# Patient Record
Sex: Male | Born: 1945 | Race: Black or African American | Hispanic: No | Marital: Married
Health system: Southern US, Community
[De-identification: ages and names within clinical notes are randomized; demographics above are authoritative.]

## PROBLEM LIST (undated history)

## (undated) ENCOUNTER — Emergency Department: Discharge status: Absent without leave | Payer: Self-pay

---

## 2004-05-20 ENCOUNTER — Ambulatory Visit: Payer: Self-pay

## 2009-02-02 ENCOUNTER — Emergency Department: Payer: Self-pay | Admitting: Emergency Medicine

## 2009-02-02 ENCOUNTER — Inpatient Hospital Stay: Payer: Self-pay | Admitting: Internal Medicine

## 2009-10-31 ENCOUNTER — Ambulatory Visit: Payer: Self-pay | Admitting: Internal Medicine

## 2009-11-24 ENCOUNTER — Inpatient Hospital Stay: Payer: Self-pay | Admitting: Internal Medicine

## 2009-11-28 LAB — PSA

## 2009-11-30 ENCOUNTER — Ambulatory Visit: Payer: Self-pay | Admitting: Internal Medicine

## 2010-03-26 ENCOUNTER — Ambulatory Visit: Payer: Self-pay | Admitting: Gastroenterology

## 2010-06-01 ENCOUNTER — Ambulatory Visit: Payer: Self-pay | Admitting: Internal Medicine

## 2010-06-08 ENCOUNTER — Inpatient Hospital Stay: Payer: Self-pay | Admitting: Internal Medicine

## 2010-07-01 ENCOUNTER — Ambulatory Visit: Payer: Self-pay | Admitting: Internal Medicine

## 2011-01-30 ENCOUNTER — Ambulatory Visit: Payer: Self-pay | Admitting: Ophthalmology

## 2011-01-30 DIAGNOSIS — I119 Hypertensive heart disease without heart failure: Secondary | ICD-10-CM

## 2011-02-10 ENCOUNTER — Emergency Department: Payer: Self-pay | Admitting: Emergency Medicine

## 2011-03-05 ENCOUNTER — Ambulatory Visit: Payer: Self-pay | Admitting: Internal Medicine

## 2011-03-13 ENCOUNTER — Observation Stay: Payer: Self-pay | Admitting: Internal Medicine

## 2011-03-13 LAB — CBC
HGB: 9 g/dL — ABNORMAL LOW (ref 13.0–18.0)
MCH: 26.5 pg (ref 26.0–34.0)
MCV: 81 fL (ref 80–100)
Platelet: 185 10*3/uL (ref 150–440)
RBC: 3.41 10*6/uL — ABNORMAL LOW (ref 4.40–5.90)
RDW: 14.1 % (ref 11.5–14.5)
WBC: 7.9 10*3/uL (ref 3.8–10.6)

## 2011-03-13 LAB — CK TOTAL AND CKMB (NOT AT ARMC)
CK, Total: 61 U/L (ref 35–232)
CK-MB: <0.5 ng/mL — ABNORMAL LOW (ref 0.5–3.6)

## 2011-03-13 LAB — COMPREHENSIVE METABOLIC PANEL
Albumin: 3.7 g/dL (ref 3.4–5.0)
Alkaline Phosphatase: 69 U/L (ref 50–136)
Anion Gap: 10 (ref 7–16)
Bilirubin,Total: 0.3 mg/dL (ref 0.2–1.0)
Calcium, Total: 9 mg/dL (ref 8.5–10.1)
Chloride: 109 mmol/L — ABNORMAL HIGH (ref 98–107)
Co2: 23 mmol/L (ref 21–32)
EGFR (Non-African Amer.): 54 — ABNORMAL LOW
Glucose: 84 mg/dL (ref 65–99)
Potassium: 4 mmol/L (ref 3.5–5.1)
SGOT(AST): 14 U/L — ABNORMAL LOW (ref 15–37)
Total Protein: 7.1 g/dL (ref 6.4–8.2)

## 2011-03-13 LAB — PROTIME-INR: Prothrombin Time: 22.7 secs — ABNORMAL HIGH (ref 11.5–14.7)

## 2011-03-13 LAB — TROPONIN I: Troponin-I: <0.02 ng/mL

## 2011-03-14 LAB — BASIC METABOLIC PANEL
Anion Gap: 12 (ref 7–16)
BUN: 18 mg/dL (ref 7–18)
Chloride: 105 mmol/L (ref 98–107)
Co2: 24 mmol/L (ref 21–32)
Creatinine: 1.53 mg/dL — ABNORMAL HIGH (ref 0.60–1.30)
Potassium: 4.3 mmol/L (ref 3.5–5.1)

## 2011-03-14 LAB — CK TOTAL AND CKMB (NOT AT ARMC)
CK, Total: 64 U/L (ref 35–232)
CK, Total: 70 U/L (ref 35–232)
CK-MB: 0.8 ng/mL (ref 0.5–3.6)
CK-MB: 0.6 ng/mL (ref 0.5–3.6)

## 2011-03-14 LAB — CBC WITH DIFFERENTIAL/PLATELET
Basophil #: 0 10*3/uL (ref 0.0–0.1)
Basophil %: 0.3 %
Eosinophil #: 0.1 10*3/uL (ref 0.0–0.7)
Lymphocyte #: 1.4 10*3/uL (ref 1.0–3.6)
MCV: 80 fL (ref 80–100)
Neutrophil #: 4.7 10*3/uL (ref 1.4–6.5)
Platelet: 184 10*3/uL (ref 150–440)
RBC: 3.47 10*6/uL — ABNORMAL LOW (ref 4.40–5.90)
RDW: 14.2 % (ref 11.5–14.5)
WBC: 6.6 10*3/uL (ref 3.8–10.6)

## 2011-03-14 LAB — URINALYSIS, COMPLETE
Bacteria: NONE SEEN
Bilirubin,UR: NEGATIVE
Ketone: NEGATIVE
Ph: 5 (ref 4.5–8.0)
RBC,UR: NONE SEEN /HPF (ref 0–5)
Squamous Epithelial: NONE SEEN
WBC UR: NONE SEEN /HPF (ref 0–5)

## 2011-03-14 LAB — TROPONIN I: Troponin-I: 0.02 ng/mL

## 2011-03-14 LAB — LIPID PANEL
Cholesterol: 127 mg/dL (ref 0–200)
HDL Cholesterol: 34 mg/dL — ABNORMAL LOW (ref 40–60)
Triglycerides: 128 mg/dL (ref 0–200)

## 2011-03-14 LAB — IRON AND TIBC: Iron: 75 ug/dL (ref 65–175)

## 2011-03-14 LAB — PROTIME-INR: INR: 2.1

## 2011-03-15 LAB — BASIC METABOLIC PANEL
Anion Gap: 9 (ref 7–16)
Calcium, Total: 9 mg/dL (ref 8.5–10.1)
Chloride: 108 mmol/L — ABNORMAL HIGH (ref 98–107)
Co2: 25 mmol/L (ref 21–32)
Glucose: 91 mg/dL (ref 65–99)

## 2011-03-15 LAB — PROTIME-INR: Prothrombin Time: 24.7 secs — ABNORMAL HIGH (ref 11.5–14.7)

## 2011-10-04 IMAGING — NM NM LUNG SCAN
2 series · 16 of 16 positions shown · non-contrast
Comparison: none

REASON FOR EXAM: syncope/ elev dd /prostate ca
COMMENTS:

[Series 1000: lung perfusion · 1.95mm/px · 4 acquisitions, 8 frames shown]
[im 1/4]
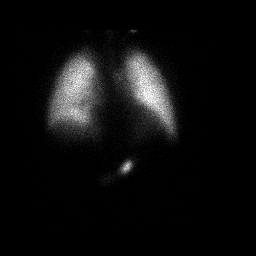
[im 1/4]
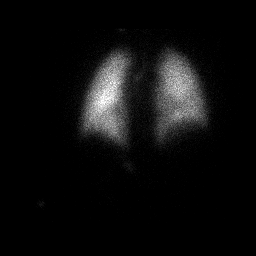
[im 2/4]
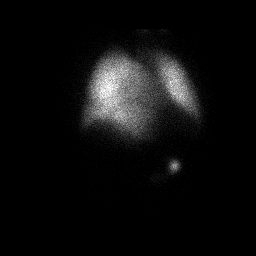
[im 2/4]
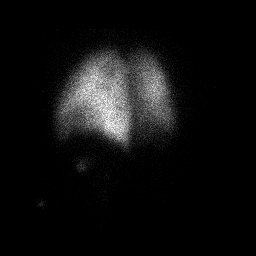
[im 3/4]
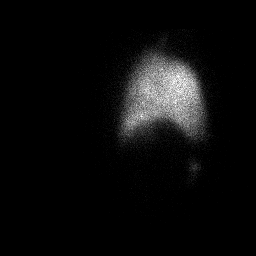
[im 3/4]
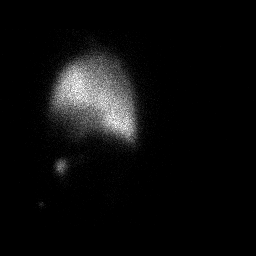
[im 4/4]
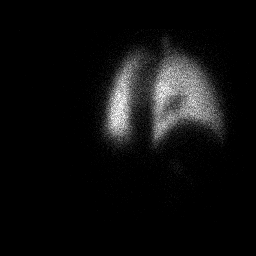
[im 4/4]
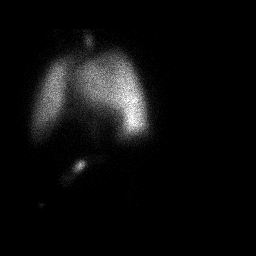

[Series 1000: lung ventilation · 3.90mm/px · 4 acquisitions, 8 frames shown]
[im 1/4]
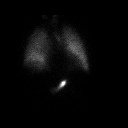
[im 1/4]
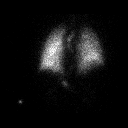
[im 2/4]
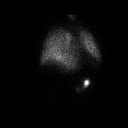
[im 2/4]
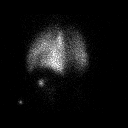
[im 3/4]
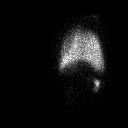
[im 3/4]
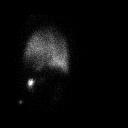
[im 4/4]
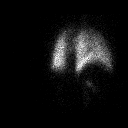
[im 4/4]
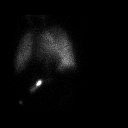

[16 of 16 positions shown; findings below may reference images not displayed]

PROCEDURE:     NM  - NM VQ LUNG SCAN  - [DATE] [DATE] [DATE]  [DATE]

RESULT:     The patient received on aerosol of 32.5 mCi of technetium 99m
labeled DTPA for the ventilation study and an intravenous infusion of
mCi of technetium 99m labeled MAA for the perfusion study. Comparison is
made to the portable chest x-ray of today's date.

Uptake of the deposition of the radiopharmaceutical on the ventilation study
is relatively symmetric from right to left. On the perfusion study I do not
see evidence of mismatched defects. There is a matched defect on the right
with the perfusion defect being greater than that on the ventilation study.
IMPRESSION: The findings are consistent with intermediate to high
probability for pulmonary embolism involving the right lung posteriorly.

This report was called by me to Dr. Nauj in the [HOSPITAL] [DATE]
p.m. on 24 November, 2009.

## 2011-10-05 IMAGING — US US EXTREM LOW VENOUS BILAT
1 series · 17 of 24 positions shown · non-contrast
Comparison: none

REASON FOR EXAM: h/o PE to see DVT
COMMENTS:

[Series 1: us extrem low venous bilat · 17 of 50 slices shown]
[im 1/50]
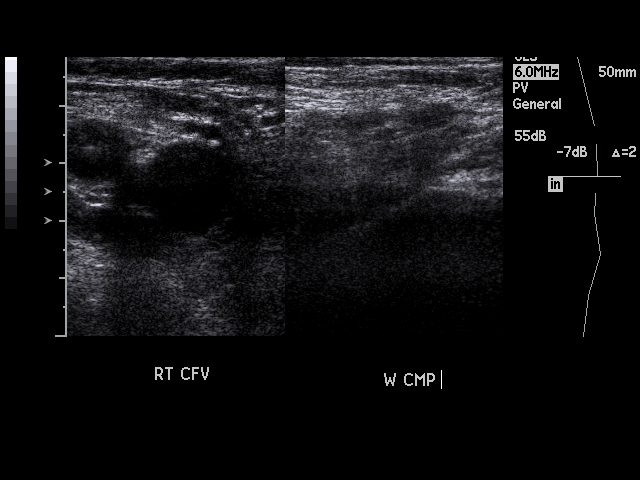
[im 5/50]
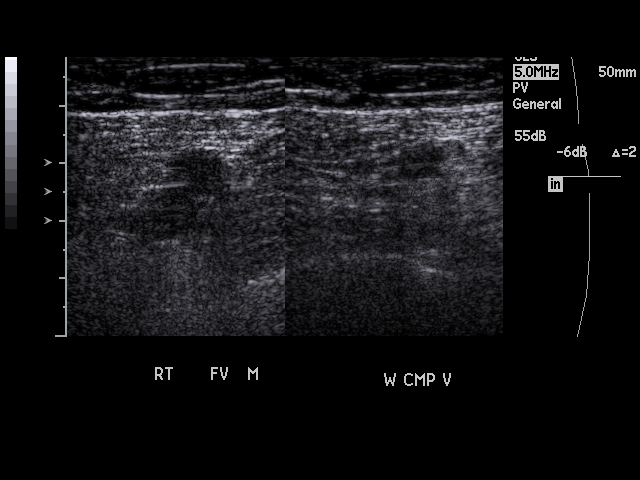
[im 7/50]
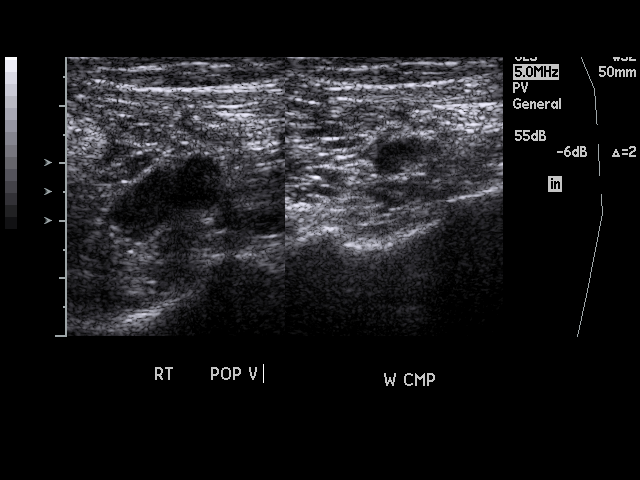
[im 9/50]
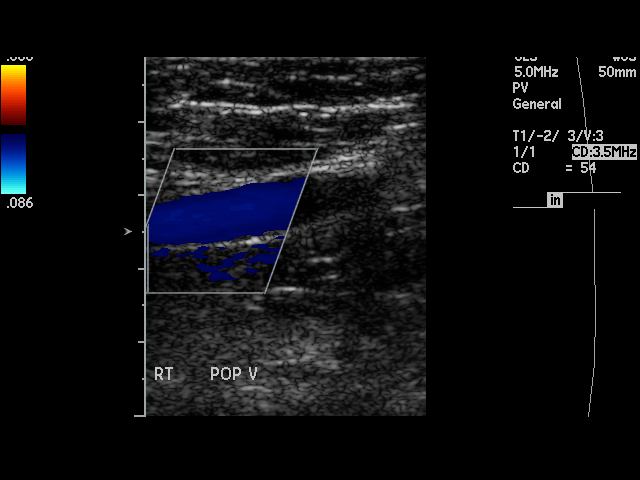
[im 13/50]
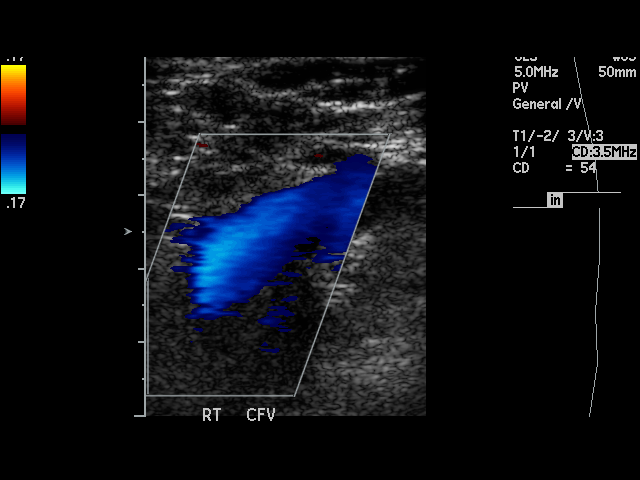
[im 15/50]
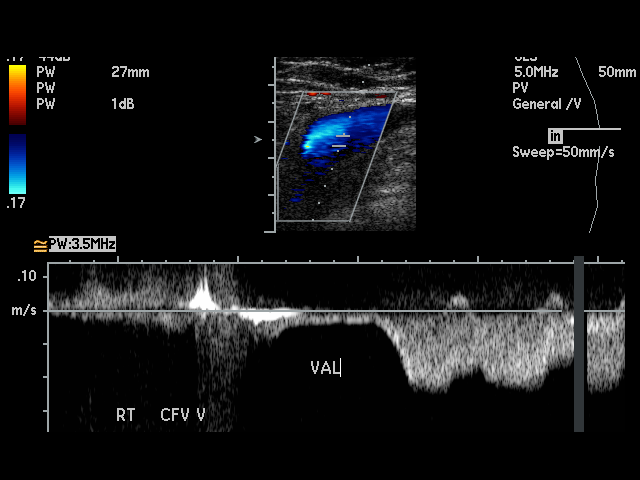
[im 20/50]
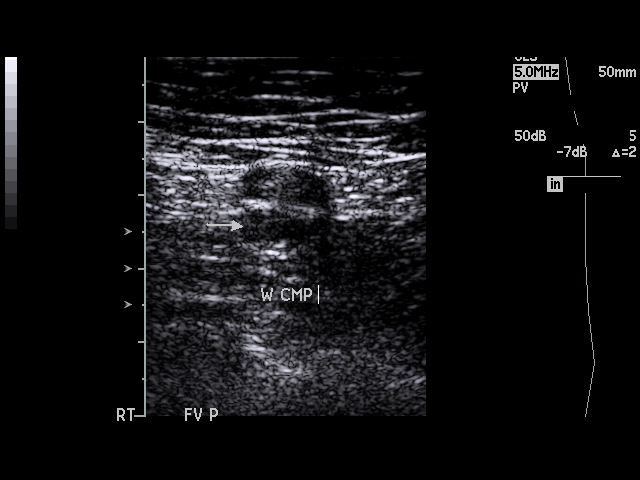
[im 22/50]
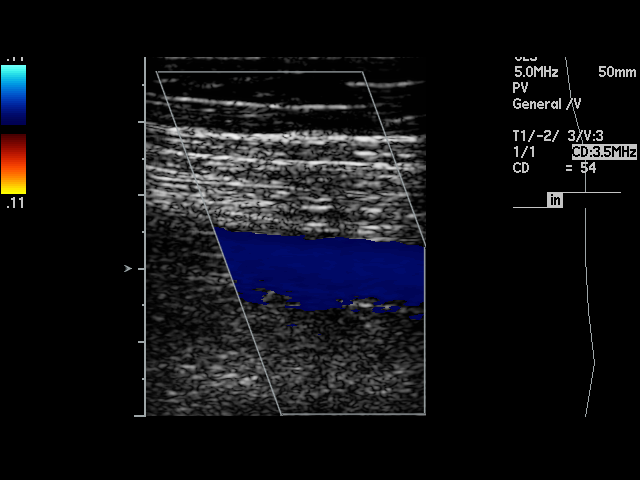
[im 26/50]
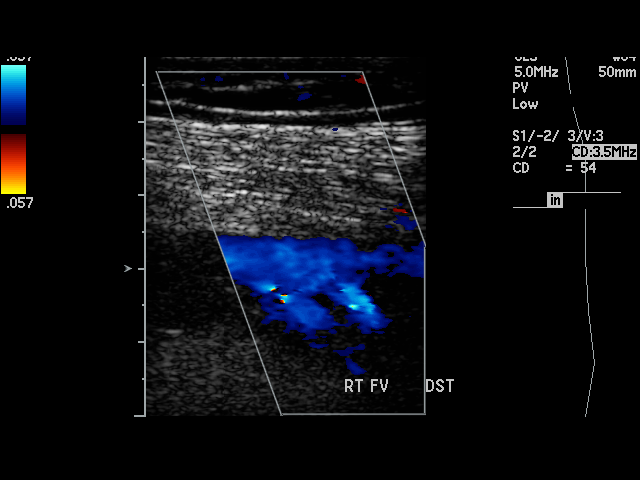
[im 28/50]
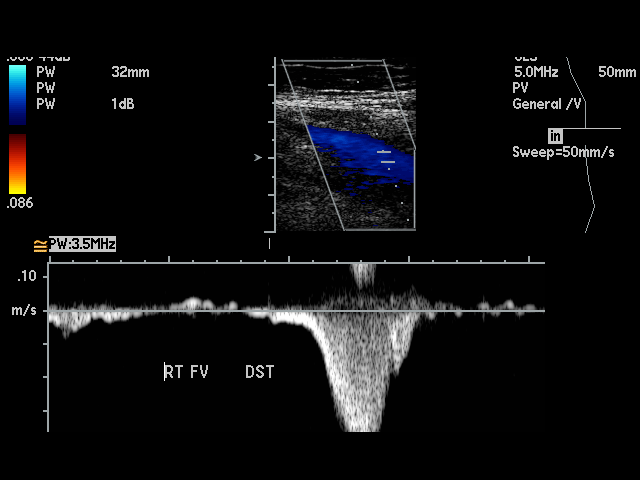
[im 30/50]
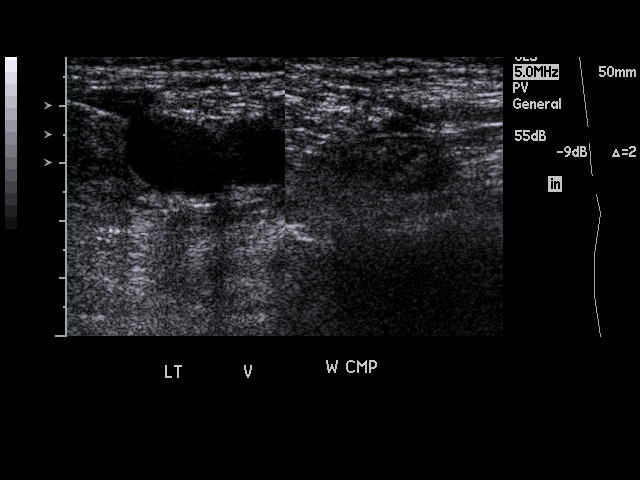
[im 35/50]
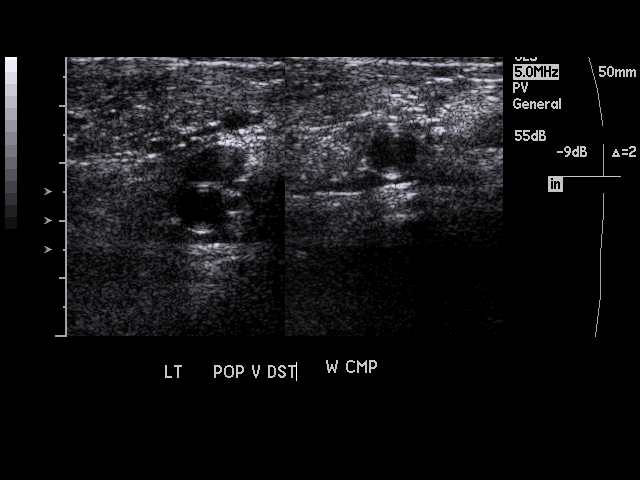
[im 37/50]
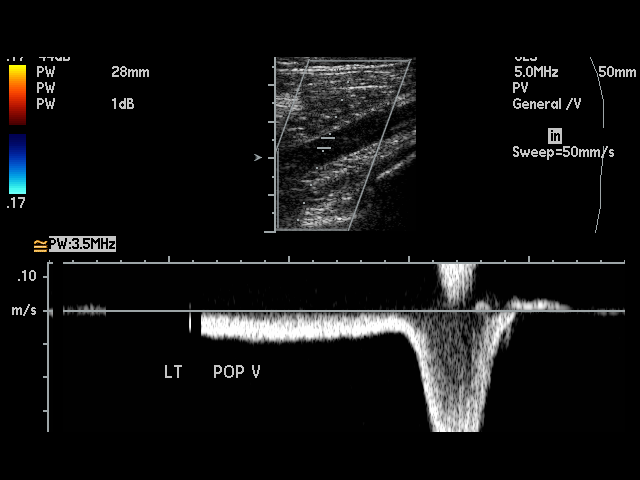
[im 41/50]
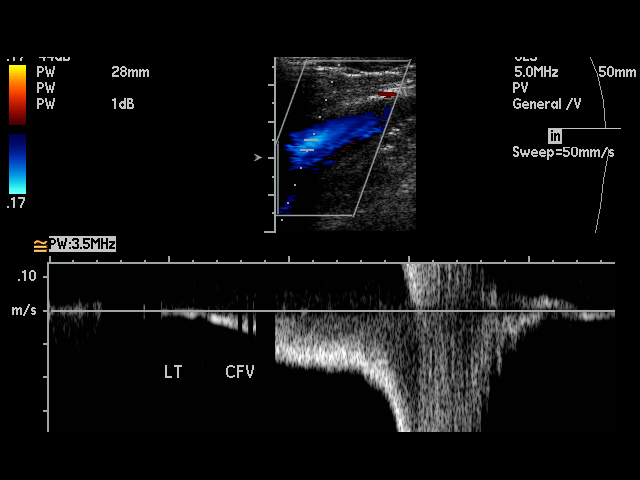
[im 43/50]
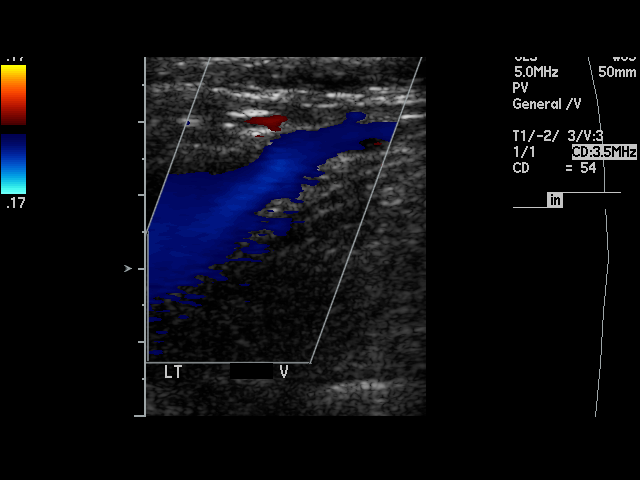
[im 45/50]
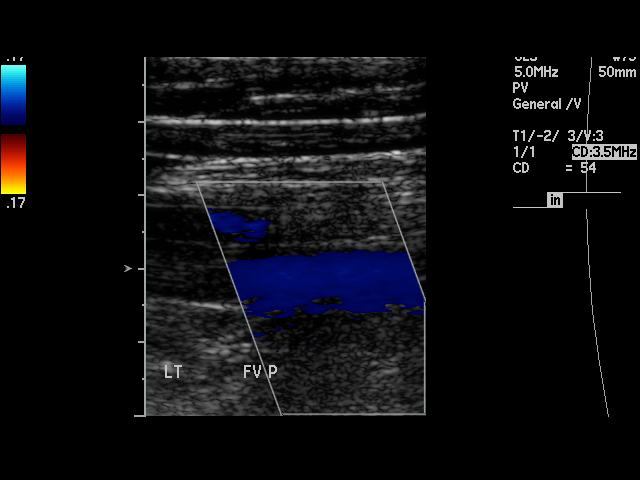
[im 50/50]
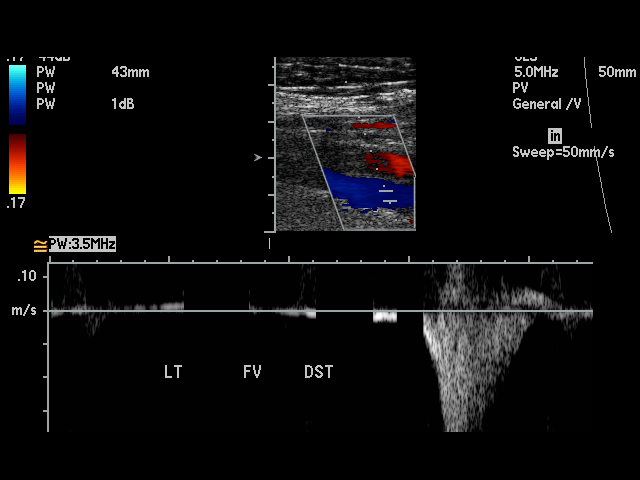

[17 of 24 positions shown; findings below may reference images not displayed]

PROCEDURE:     US  - US DOPPLER LOW EXTR BILATERAL  - November 25, 2009 [DATE]

RESULT:     Duplex Doppler interrogation of the deep venous system of both
lower extremities is performed in the standard fashion. Images demonstrate
an area of thrombus in the proximal superficial femoral vein where there is
incomplete compressibility. The thrombus appears to be nonocclusive. The
common femoral and popliteal veins compress normally and appear patent.

In the left leg the common femoral, superficial femoral and popliteal veins
show normal compressibility without DVT.
IMPRESSION: Nonocclusive superficial femoral vein thrombosis in the
right leg consistent with nonocclusive DVT.(*)

## 2011-10-30 ENCOUNTER — Emergency Department: Payer: Self-pay | Admitting: Internal Medicine

## 2011-11-27 ENCOUNTER — Emergency Department: Payer: Self-pay | Admitting: Emergency Medicine

## 2011-11-27 LAB — CBC WITH DIFFERENTIAL/PLATELET
Basophil #: 0 10*3/uL (ref 0.0–0.1)
Basophil %: 0.6 %
Eosinophil #: 0.2 10*3/uL (ref 0.0–0.7)
Eosinophil %: 4.5 %
HCT: 29.6 % — ABNORMAL LOW (ref 40.0–52.0)
HGB: 9.9 g/dL — ABNORMAL LOW (ref 13.0–18.0)
Lymphocyte #: 1.7 10*3/uL (ref 1.0–3.6)
Lymphocyte %: 31.3 %
MCHC: 33.4 g/dL (ref 32.0–36.0)
MCV: 80 fL (ref 80–100)
Monocyte #: 0.6 x10 3/mm (ref 0.2–1.0)
Monocyte %: 10.3 %
Neutrophil #: 2.9 10*3/uL (ref 1.4–6.5)
Neutrophil %: 53.3 %
RDW: 12.9 % (ref 11.5–14.5)
WBC: 5.5 10*3/uL (ref 3.8–10.6)

## 2011-11-27 LAB — BASIC METABOLIC PANEL
Anion Gap: 6 — ABNORMAL LOW (ref 7–16)
BUN: 24 mg/dL — ABNORMAL HIGH (ref 7–18)
Calcium, Total: 9.1 mg/dL (ref 8.5–10.1)
Co2: 24 mmol/L (ref 21–32)
EGFR (Non-African Amer.): 58 — ABNORMAL LOW
Glucose: 129 mg/dL — ABNORMAL HIGH (ref 65–99)
Osmolality: 283 (ref 275–301)
Potassium: 4.1 mmol/L (ref 3.5–5.1)
Sodium: 139 mmol/L (ref 136–145)

## 2011-12-04 LAB — CULTURE, BLOOD (SINGLE)

## 2012-09-29 ENCOUNTER — Ambulatory Visit: Payer: Self-pay | Admitting: Gastroenterology

## 2012-09-30 LAB — PATHOLOGY REPORT

## 2013-02-14 ENCOUNTER — Emergency Department: Payer: Self-pay | Admitting: Emergency Medicine

## 2013-02-14 LAB — CBC
HCT: 30.8 % — ABNORMAL LOW (ref 40.0–52.0)
HGB: 10.1 g/dL — ABNORMAL LOW (ref 13.0–18.0)
MCHC: 32.8 g/dL (ref 32.0–36.0)
MCV: 79 fL — ABNORMAL LOW (ref 80–100)
Platelet: 201 10*3/uL (ref 150–440)
RBC: 3.88 10*6/uL — ABNORMAL LOW (ref 4.40–5.90)

## 2013-02-14 LAB — URINALYSIS, COMPLETE
Bacteria: NONE SEEN
Hyaline Cast: 1
Ketone: NEGATIVE
Nitrite: NEGATIVE
Ph: 5 (ref 4.5–8.0)
Protein: 30
Squamous Epithelial: NONE SEEN
WBC UR: 4 /HPF (ref 0–5)

## 2013-02-14 LAB — COMPREHENSIVE METABOLIC PANEL
Albumin: 3.5 g/dL (ref 3.4–5.0)
Alkaline Phosphatase: 59 U/L
Anion Gap: 4 — ABNORMAL LOW (ref 7–16)
BUN: 24 mg/dL — ABNORMAL HIGH (ref 7–18)
Calcium, Total: 9.4 mg/dL (ref 8.5–10.1)
Creatinine: 1.27 mg/dL (ref 0.60–1.30)
EGFR (African American): >60
EGFR (Non-African Amer.): 59 — ABNORMAL LOW
Glucose: 194 mg/dL — ABNORMAL HIGH (ref 65–99)
Osmolality: 285 (ref 275–301)
Potassium: 3.5 mmol/L (ref 3.5–5.1)
SGPT (ALT): 15 U/L (ref 12–78)

## 2013-02-14 LAB — PROTIME-INR: INR: 2.1

## 2013-02-14 LAB — TROPONIN I: Troponin-I: 0.02 ng/mL

## 2013-09-02 ENCOUNTER — Emergency Department: Payer: Self-pay | Admitting: Emergency Medicine

## 2013-09-02 LAB — COMPREHENSIVE METABOLIC PANEL
ANION GAP: 9 (ref 7–16)
Albumin: 3.9 g/dL (ref 3.4–5.0)
Alkaline Phosphatase: 78 U/L
BUN: 22 mg/dL — ABNORMAL HIGH (ref 7–18)
Bilirubin,Total: 0.2 mg/dL (ref 0.2–1.0)
Calcium, Total: 9.4 mg/dL (ref 8.5–10.1)
Chloride: 109 mmol/L — ABNORMAL HIGH (ref 98–107)
Co2: 22 mmol/L (ref 21–32)
Creatinine: 1.36 mg/dL — ABNORMAL HIGH (ref 0.60–1.30)
EGFR (Non-African Amer.): 54 — ABNORMAL LOW
Glucose: 244 mg/dL — ABNORMAL HIGH (ref 65–99)
Osmolality: 291 (ref 275–301)
POTASSIUM: 3.9 mmol/L (ref 3.5–5.1)
SGOT(AST): 11 U/L — ABNORMAL LOW (ref 15–37)
SGPT (ALT): 17 U/L (ref 12–78)
SODIUM: 140 mmol/L (ref 136–145)
TOTAL PROTEIN: 7.8 g/dL (ref 6.4–8.2)

## 2013-09-02 LAB — URINALYSIS, COMPLETE
BACTERIA: NEGATIVE
Bilirubin,UR: NEGATIVE
Ketone: NEGATIVE
Leukocyte Esterase: NEGATIVE
NITRITE: NEGATIVE
Ph: 5 (ref 4.5–8.0)
Protein: NEGATIVE
Specific Gravity: 1.022 (ref 1.003–1.030)

## 2013-09-02 LAB — CBC WITH DIFFERENTIAL/PLATELET
BASOS ABS: 0 10*3/uL (ref 0.0–0.1)
BASOS PCT: 0.5 %
Eosinophil #: 0.1 10*3/uL (ref 0.0–0.7)
Eosinophil %: 2.1 %
HCT: 32.4 % — ABNORMAL LOW (ref 40.0–52.0)
HGB: 10.6 g/dL — ABNORMAL LOW (ref 13.0–18.0)
Lymphocyte #: 1.2 10*3/uL (ref 1.0–3.6)
Lymphocyte %: 16.5 %
MCH: 26.4 pg (ref 26.0–34.0)
MCHC: 32.7 g/dL (ref 32.0–36.0)
MCV: 81 fL (ref 80–100)
MONO ABS: 0.4 x10 3/mm (ref 0.2–1.0)
MONOS PCT: 5.4 %
NEUTROS ABS: 5.3 10*3/uL (ref 1.4–6.5)
Neutrophil %: 75.5 %
PLATELETS: 185 10*3/uL (ref 150–440)
RBC: 4 10*6/uL — AB (ref 4.40–5.90)
RDW: 14.7 % — AB (ref 11.5–14.5)
WBC: 7 10*3/uL (ref 3.8–10.6)

## 2014-05-08 ENCOUNTER — Emergency Department: Payer: Self-pay | Admitting: Emergency Medicine

## 2014-06-24 NOTE — H&P (Signed)
PATIENT NAME:  Kyle Greer, Kyle Greer MR#:  [id number] DATE OF BIRTH:  1945-03-23  DATE OF ADMISSION:  03/13/2011  REFERRING PHYSICIAN: Dr. Shaune PollackLord   CHIEF COMPLAINT: Chest pain, confusion.   HISTORY OF PRESENT ILLNESS: The patient is a 69 year old African American male with history of PE cardiomyopathy with initial EF of 25% which improved to 55% on later echo's, diabetes, hypertension, and history of CHF who brought in by ambulance after complaints of chest pain. The patient had [facility]. He had gone to check his INR and also his blood sugars. The patient's blood sugars had been running on the higher side up to 700's as of last month. There he was noted with sugars of greater than 300's. He was given insulin twice per patient. He has never been on insulin before and he is on metformin and glipizide. The patient went home and there he had chest pains around 2 to 3 p.m. They lasted about 20 minutes and he called the ambulance. The patient was brought in here and noted to have confusion, altered mental status, and significant lethargy. Blood sugars were checked and they were 33 and on repeat it was 29. He was diaphoretic and confused. Upon D50 administration, his mental status significantly improved and blood sugars were checked and they were in the 190's. The patient states that he has had no chest pains before the administration of insulin. Currently he is chest pain free. Hospitalist services were contacted for admission and observation to rule out MI. He denies having any dizziness, shortness of breath, or visual changes currently.   PAST MEDICAL HISTORY:  1. History of syncope in the past. 2. History of PE. 3. Diabetes. 4. History of congestive heart failure. 5. 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 Last echo was 55% last year.  6. History of prostate cancer,  on Lupron therapy. 7. History of alcohol and tobacco abuse in the past. 8. Hypertension.   ALLERGIES: No known drug allergies.   MEDICATIONS:  1. Coumadin 7.5 mg every day except Tuesdays and Thursdays where it is 10 mg. 2. Norvasc 5 mg daily.  3. Glucotrol XL 10 mg twice a day. 4. Lupron Depo, unknown dose. 5. Vitamin D3 1000 units daily.  6. Calcium with Vitamin D 1 tab b.i.d.  7. Omeprazole 20 mg twice daily. 8. Pravachol 20 mg at bedtime.  9. Ferrous sulfate 325 mg t.i.d.  10. Flomax 0.4 mg daily.  11. Coreg 25 mg twice a day. 12. Metformin 500 mg twice daily.   FAMILY HISTORY: History of early coronary artery disease, MI, and cancer.   SOCIAL HISTORY: Ex-smoker, quit multiple years ago. Quit drinking 11 years ago. Retired.   REVIEW OF SYSTEMS: No fever or fatigue. Weakness today which resolved. EYES: No blurry vision, double vision, or redness. ENT: No tinnitus or hearing loss. No snoring. RESPIRATORY: No cough, hemoptysis, or dyspnea. CARDIOVASCULAR: Episode of chest pain earlier today, now resolved. No orthopnea or edema. No history of arrhythmia. History of cardiomyopathy and CHF. GI: Had nausea and vomiting in the ambulance, now better. No abdominal pain or melena. GU: No dysuria or hematuria. ENDOCRINE: No polyuria or nocturia. HEME/LYMPH: History of anemia. No bleeding or swollen glands. SKIN: No rashes. MUSCULOSKELETAL: No muscle or joint pain. NEUROLOGIC: No weakness currently. No ataxia or dementia. PSYCH: No anxiety or insomnia.   PHYSICAL EXAMINATION:   VITAL  SIGNS: Temperature 97.3, pulse rate 61, respiratory rate 16, blood pressure currently 114/77, oxygen sat 98% on room air.   GENERAL: The patient is currently awake, alert, oriented x3 talking in full sentences not acutely distressed. Generally he is an [place] American male laying in bed, slight diaphoresis.   CARDIOVASCULAR: S1, S2 regular rate and rhythm. No murmurs, rubs, or gallops.   HEENT: Normocephalic,  atraumatic. Pupils are equal and reactive. Anicteric sclerae. Moist mucous membranes.   NECK: Supple. No thyroid tenderness or JVD.   LUNGS: Clear to auscultation. No wheezing or rhonchi.   ABDOMEN: Soft, nontender, nondistended. Positive bowel sounds.   EXTREMITIES: No significant edema.   NEUROLOGIC: Cranial nerves II through XII grossly intact. Strength 5 out of 5 in all extremities. Sensation intact.   LABORATORY, DIAGNOSTIC, AND RADIOLOGICAL DATA: Initial glucose 33, on repeat 29. Last glucose 190's. Glucose on BMP 84. BUN 18, creatinine 1.42, sodium 142, potassium 4. Troponin negative. LFTs not significantly elevated. WBC 7.9, hemoglobin 9, hematocrit 27.6, platelets 185.   ASSESSMENT AND PLAN: We have a 69 year old African American male with history of cardiomyopathy, nonischemic, which has improved, diabetes, hypertension, not on insulin, PE on Coumadin with recent hypoglycemic episodes who presents with severe hypoglycemia, chest pain, and sinus bradycardia.  1. Chest pain. This likely was precipitated by severe hypoglycemia that the patient experienced after getting insulin. We do not know how much insulin he got or what type of insulin he got. Will admit the patient for observation to monitor the blood glucoses frequently. He has no further chest pains. EKG did not show any acute ST elevations or depression. He had a clean cath in 2010 per records and I would continue aspirin, reduce the dose of the Coreg given the sinus bradycardia, and resume the statin. Would check lipids, hemoglobin A1c, and check cyclic cardiac markers. Would also check an echocardiogram. Would not order a stress test because this episode of chest pain was in the setting of severe hypoglycemia which the patient experienced.  2. Hypoglycemia. This is better now with D50. Would not give further insulin and hold the patient's glipizide and metformin. Will check a hemoglobin A1c and if no further hypoglycemic episodes  would likely discharge the patient tomorrow.  3. Hypertension. Would hold amlodipine and continue Coreg at a lower dose, however, given the sinus bradycardia on EKG.  4. Altered mental status. The patient experienced lethargy and likely metabolic encephalopathy from hypoglycemia which appears to be resolved. Monitor the patient overnight.  5. History of PE. Will check a PT and INR and resume the Coumadin.  CODE STATUS: FULL CODE.   TOTAL TIME SPENT: 45 minutes.   ____________________________ Krystal Eaton, MD sa:drc D: 03/13/2011 19:34:27 ET T: 03/14/2011 07:35:17 ET JOB#: [id number]  cc: Krystal Eaton, MD, <Dictator> Angus Palms, MD Krystal Eaton MD ELECTRONICALLY SIGNED 03/31/2011 15:48

## 2014-06-24 NOTE — Discharge Summary (Signed)
PATIENT NAME:  Kyle Greer, Kyle Greer MR#:  [id number] DATE OF BIRTH:  Dec 09, 1945  DATE OF ADMISSION:  03/13/2011 DATE OF DISCHARGE:  03/15/2011  PRIMARY CARE PHYSICIAN: Dr. Greggory Stallion   PRIMARY CARDIOLOGIST: Dr. Juliann Pares   PRIMARY HEMATOLOGIST: Dr. Haze Rushing   REASON FOR ADMISSION: Chest pain and confusion.   DISCHARGE DIAGNOSES:  1. Chest pain, resolved, atypical, suspected noncardiac. Could be from anxiety or musculoskeletal component versus related to hypoglycemia. He is status post cardiac cath December 2010 revealing normal coronary arteries.  2. Hypoglycemia, resolved. 3. Confusion with metabolic encephalopathy due to hypoglycemia.  4. Diabetes mellitus which is extremely poorly controlled/uncontrolled with hemoglobin A1c of 15.4, failing oral hypoglycemics and patient has been switched to insulin.  5. Acute renal failure, prerenal, resolved.  6. History of hypertension. 7. History of chronic anemia with previous workup revealing slight iron deficiency. The patient has also had a GI workup with negative EGD and colonoscopy.  8. History of PE.  9. 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  10. History of syncope.  11. History of prostate cancer.  12. History of alcohol abuse. 13. History of tobacco abuse.   CONSULTS: None.   PROCEDURE: Portable chest x-ray 03/13/2011 no acute cardiopulmonary abnormalities.   PERTINENT LABORATORY DATA: Creatinine 1.42 on admission, 1.16 on the day of discharge. Iron panel serum iron 75, TIBC 238, iron sat 32, ferritin 598. Hemoglobin A1c 15.4. Lipid panel total cholesterol 127, triglycerides 128, HDL 34, LDL 67. LFTs normal on admission. Cardiac enzymes negative x3 sets with negative CK, CK-MB, and troponins. Hemoglobin 9 on admission, 9.2 from 03/14/2011. INR 2 on admission,  2.2 on the day of discharge. EKG on admission sinus rhythm without acute ST or T wave changes. Serum glucose 91 on the day of discharge.   BRIEF HISTORY/HOSPITAL COURSE: The patient is a 69 year old male with past medical history of hypertension, diabetes mellitus, syncope, PE, nonischemic cardiomyopathy and CHF with previous EF 25%, most recent EF 55%, history of prostate cancer, tobacco and alcohol abuse who presented to the Emergency Department with complaints of chest pain and was confused. Please see dictated admission history and physical for pertinent details surrounding the onset of this hospitalization. Please see below for further details.  1. Confusion felt to be metabolic encephalopathy due to hypoglycemia as the patient's glucose levels were less than 60 prior to arrival. Hypoglycemia was felt to be a combination of pills (metformin plus sulfonylurea) plus he had received two injections of insulin the same day at his primary care physician's office due to uncontrolled hyperglycemia. He came in in a confused state. Initially his oral hypoglycemics in addition to insulin was held and he was given IV D50 which led to resolution of his hypoglycemia and also resolution of his metabolic encephalopathy and confusion and mental status returned back to baseline after resolution of his hypoglycemia. Confusion has not recurred. Sugars have stabilized and started to become elevated.  2. In regards to his type II diabetes mellitus, this appears to be extremely poorly controlled with hemoglobin A1c of 15.4 and has been failing oral hypoglycemics. Therefore, glipizide and metformin have been stopped and he has been started on insulin consisting of Lantus and sliding scale insulin. His blood glucose levels were much better controlled thereafter. The patient has received diabetic education and insulin teaching. He will be discharged on Lantus and NovoLog sliding scale  insulin and will be referred to Endocrinology  upon hospital discharge for diabetic management.  3. Chest pain, felt to be noncardiac and was atypical and EKG without acute ST-T wave changes and he has ruled out for MI based on three negative sets of cardiac enzymes. He had a cardiac cath in December 2010 revealing normal coronary arteries. The patient's most recent echocardiogram revealed EF 55% with no wall motion abnormalities. Cardiac etiology of his chest pain was felt to be less likely and his chest pain was felt to be due to anxiety or musculoskeletal component versus hypoglycemia. His chest pain had promptly resolved without specific therapy and has not recurred since. He was ambulated on the day of discharge without any chest pain at rest or with ambulation. No further cardiac diagnostics are recommended at this time and the patient can continue to follow-up with his cardiologist upon hospital discharge and the patient was agreeable to this plan.  4. Hypertension. The patient had some episodes of uncontrolled hypertension during this hospitalization and antihypertensives have been adjusted and thereafter his blood pressure has been much better controlled. His Coreg dose was reduced given mild bradycardia which is asymptomatic and his heart rate has normalized after decreasing Coreg. Cannot increase the dose of his beta-blocker any further at this time. Have added Norvasc to his regimen. Would avoid ACE inhibitors for now as he came with acute renal failure. Blood pressure is currently well controlled on Coreg and Norvasc and he will need close outpatient follow-up for routine blood pressure checks.  5. Acute renal failure, nonoliguric, suspect this is prerenal and possibly from volume depletion as the patient's renal function has normalized with IV fluids and hydration.  6. Chronic anemia. Previous work-up had revealed slight iron deficiency and the patient had a GI work-up in the past with negative EGD and colonoscopy. There were no indications for  blood transfusion during this hospitalization and he will need close monitoring of his hemoglobin and hematocrit as an outpatient and will follow-up with Dr. Haze RushingYbanez in this regard. In the meanwhile, he will continue iron supplementation therapy.   7. History of PE. The patient is to continue Coumadin. His INR was therapeutic on admission and also on discharge.  8. 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 In terms of his cardiomyopathy, he had remained well compensated during this hospitalization. He will continue medical management with Coreg. He was not given any ACE inhibitors for now as he came with acute renal failure. He is currently euvolemic after IV fluids. He was not volume overloaded and there is no role for diuretic currently. He will continue to follow-up with his cardiologist upon hospital discharge.   On 03/14/2010 the patient was hemodynamically stable without any chest pain and was not confused and was felt to be stable for discharge home with close outpatient follow-up to which the patient was agreeable.   DISCHARGE DISPOSITION: Home.   DISCHARGE CONDITION: Improved, stable.   DISCHARGE ACTIVITY: As tolerated.   DISCHARGE DIET: Low sodium, ADA, low fat, low cholesterol.   DISCHARGE MEDICATIONS:  1. Coumadin 7.5 mg p.o. daily except 10 mg on Tuesday, Thursday, and Saturday.  2. Lantus 20 units subcutaneously at bedtime.  3. NovoLog sliding scale insulin q.a.c. and at bedtime. See prescription for sliding scale insulin.  4. Coreg  12.5 mg p.o. b.i.d.  5. Flomax 0.4 mg p.o. daily.  6. Norvasc 5 mg daily.  7. Vitamin D3 1000 units daily.  8. Calcium carbonate 600 mg p.o. b.i.d.  9. Pravachol 20 mg p.o. daily.  10. Iron sulfate 325 mg p.o.  t.i.d.  11. Omeprazole/sodium bicarbonate 20 mg/1100 mg take one cap p.o. b.i.d.   DISCHARGE INSTRUCTIONS:  1. Take medications as prescribed.  2. Return to the Emergency Department for recurrence of symptoms.  DO NOT TAKE: Do not take glipizide or metformin for now until further advised by Endocrinology.   FOLLOW-UP INSTRUCTIONS:  1. Follow-up with Dr. Greggory Stallion within 1 to 2 weeks. The patient needs repeat PT/INR and repeat BMP within one week. 2. Follow-up with Dr. Juliann Pares within 1 to 2 weeks.  3. Follow-up with Dr. Haze Rushing within 2 to 3 weeks. The patient needs repeat hemoglobin and hematocrit check within one week.   REFERRAL: The patient is being referred to Dr. Tedd Sias of Endocrinology within 1 to 2 weeks for uncontrolled diabetes mellitus.   TIME SPENT ON DISCHARGE: Greater than 30 minutes.  ____________________________ Elon Alas, MD knl:drc D: 03/18/2011 18:06:53 ET T: 03/19/2011 12:48:34 ET JOB#: [id number] cc: Elon Alas, MD, <Dictator>, Angus Palms, MD, Dwayne D. Juliann Pares, MD, Legrand Pitts, MD, Macy Mis, MD Elon Alas MD ELECTRONICALLY SIGNED 03/31/2011 18:42

## 2014-12-25 IMAGING — CR DG CHEST 2V
1 series · 2 of 2 positions shown · non-contrast
Comparison: 03/13/2011.

CLINICAL DATA: Chest pain.

EXAM:
CHEST  2 VIEW

[Series 1: pa · 0.17mm/px · 2 of 2 slices shown]
[im 1/2]
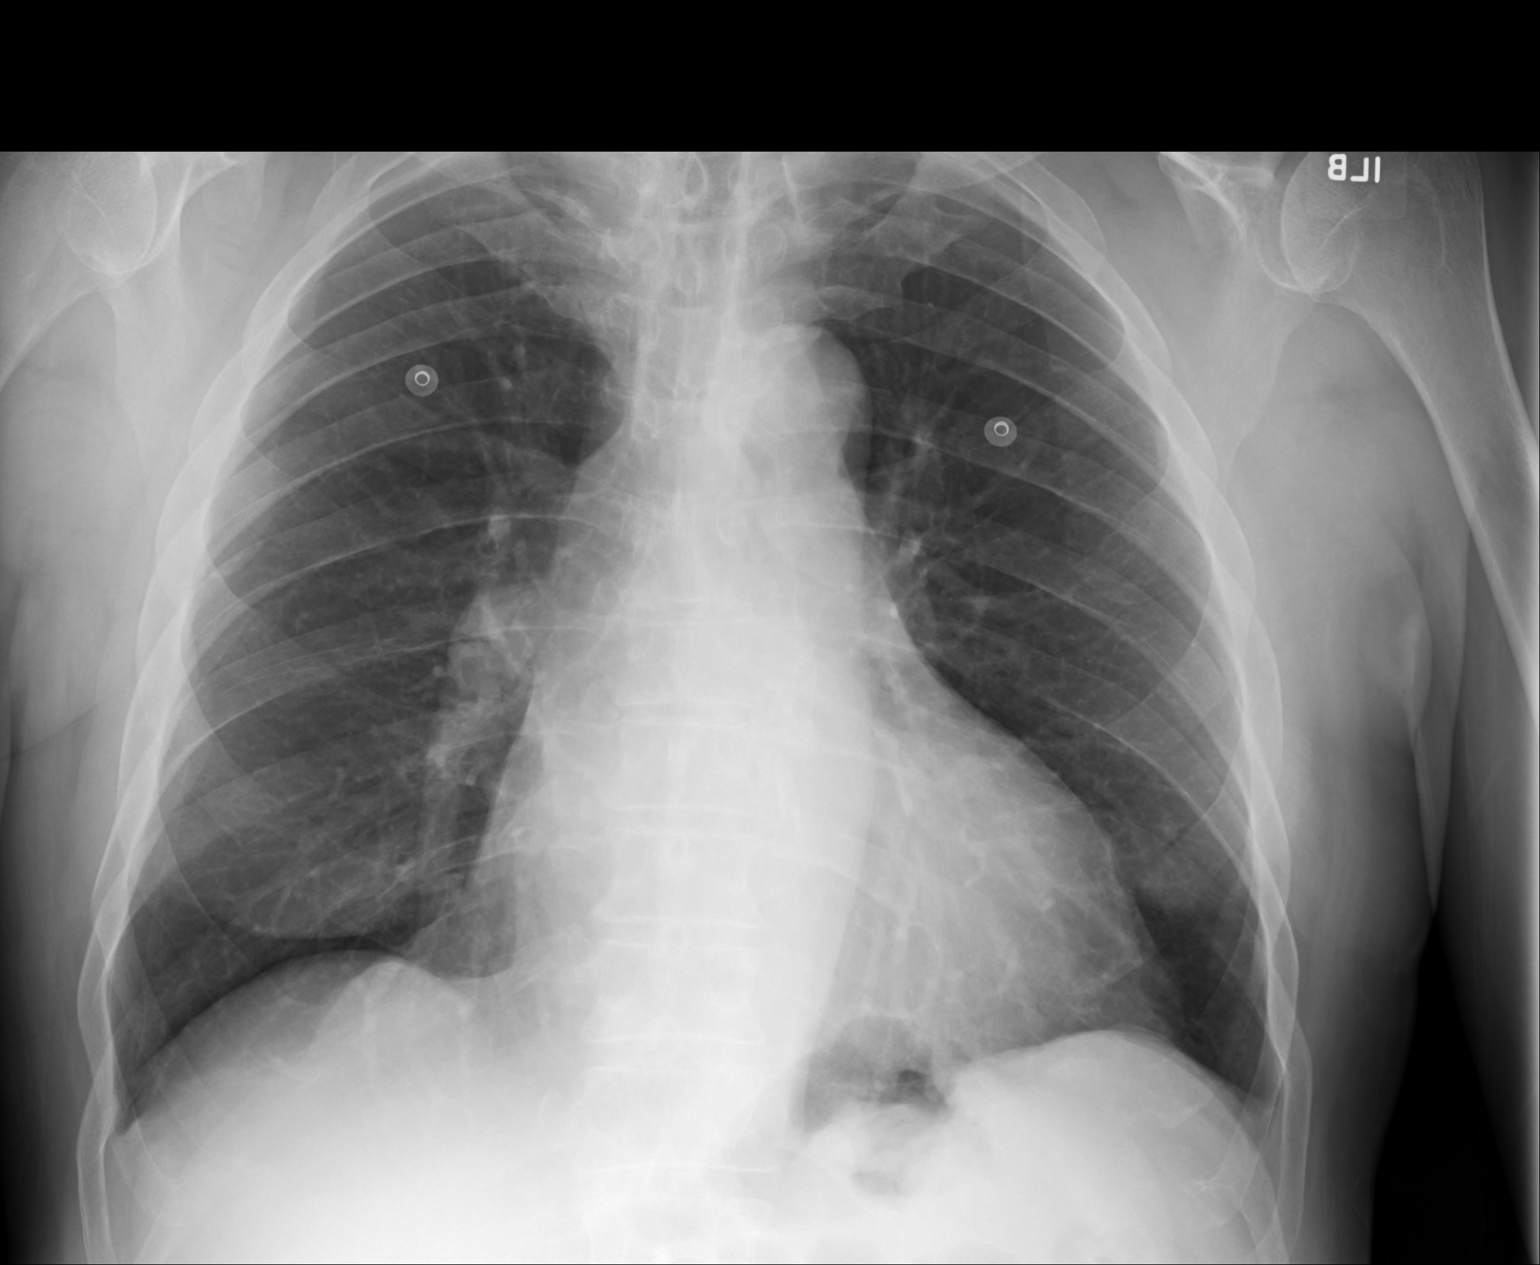
[im 2/2]
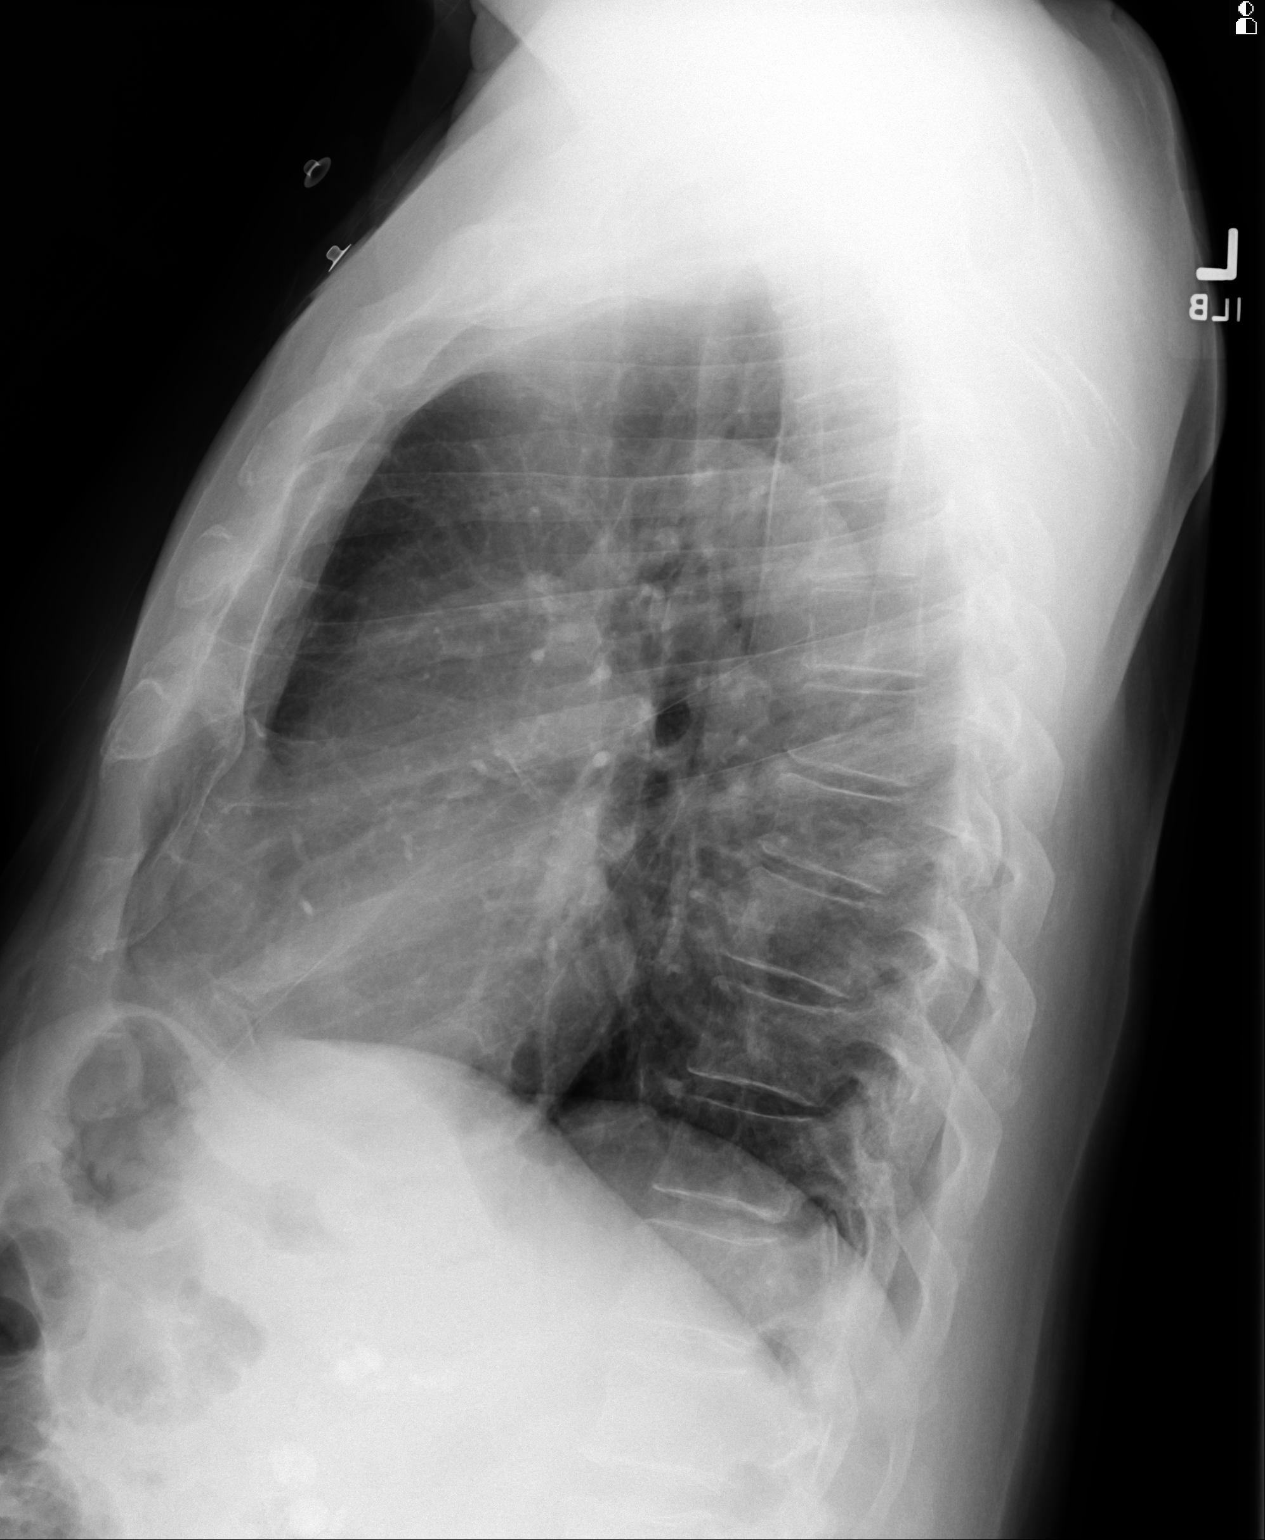

[2 of 2 positions shown; findings below may reference images not displayed]

FINDINGS: The heart size and mediastinal contours are within normal limits.
Both lungs are clear. The visualized skeletal structures are
unremarkable.
IMPRESSION: No active cardiopulmonary disease.
